# Patient Record
Sex: Female | Born: 1955 | Race: Black or African American | Hispanic: No | Marital: Married | Smoking: Current every day smoker
Health system: Southern US, Community
[De-identification: ages and names within clinical notes are randomized; demographics above are authoritative.]

---

## 2007-10-03 ENCOUNTER — Ambulatory Visit: Payer: Self-pay | Admitting: Family Medicine

## 2008-10-16 ENCOUNTER — Inpatient Hospital Stay: Payer: Self-pay | Admitting: Internal Medicine

## 2014-01-07 ENCOUNTER — Ambulatory Visit: Payer: Self-pay | Admitting: Emergency Medicine

## 2014-02-03 ENCOUNTER — Inpatient Hospital Stay: Payer: Self-pay | Admitting: Internal Medicine

## 2014-02-03 LAB — PROTIME-INR
INR: 0.8
PROTHROMBIN TIME: 11.5 s (ref 11.5–14.7)

## 2014-02-03 LAB — COMPREHENSIVE METABOLIC PANEL
ANION GAP: 13 (ref 7–16)
Albumin: 3 g/dL — ABNORMAL LOW (ref 3.4–5.0)
Alkaline Phosphatase: 89 U/L
BILIRUBIN TOTAL: 0.4 mg/dL (ref 0.2–1.0)
BUN: 9 mg/dL (ref 7–18)
CHLORIDE: 79 mmol/L — AB (ref 98–107)
CO2: 27 mmol/L (ref 21–32)
Calcium, Total: 9.1 mg/dL (ref 8.5–10.1)
Creatinine: 0.58 mg/dL — ABNORMAL LOW (ref 0.60–1.30)
EGFR (Non-African Amer.): >60
Glucose: 97 mg/dL (ref 65–99)
OSMOLALITY: 239 (ref 275–301)
POTASSIUM: 3.7 mmol/L (ref 3.5–5.1)
SGOT(AST): 30 U/L (ref 15–37)
SGPT (ALT): 19 U/L (ref 12–78)
Sodium: 119 mmol/L — CL (ref 136–145)
TOTAL PROTEIN: 7.4 g/dL (ref 6.4–8.2)

## 2014-02-03 LAB — CBC WITH DIFFERENTIAL/PLATELET
BASOS PCT: 0.5 %
Basophil #: 0.1 10*3/uL (ref 0.0–0.1)
EOS ABS: 0 10*3/uL (ref 0.0–0.7)
Eosinophil %: 0.3 %
HCT: 24.6 % — ABNORMAL LOW (ref 35.0–47.0)
HGB: 8.5 g/dL — AB (ref 12.0–16.0)
LYMPHS PCT: 11.1 %
Lymphocyte #: 1.3 10*3/uL (ref 1.0–3.6)
MCH: 33.3 pg (ref 26.0–34.0)
MCHC: 34.4 g/dL (ref 32.0–36.0)
MCV: 97 fL (ref 80–100)
Monocyte #: 0.8 x10 3/mm (ref 0.2–0.9)
Monocyte %: 6.8 %
NEUTROS ABS: 9.4 10*3/uL — AB (ref 1.4–6.5)
Neutrophil %: 81.3 %
PLATELETS: 422 10*3/uL (ref 150–440)
RBC: 2.54 10*6/uL — ABNORMAL LOW (ref 3.80–5.20)
RDW: 12.9 % (ref 11.5–14.5)
WBC: 11.6 10*3/uL — ABNORMAL HIGH (ref 3.6–11.0)

## 2014-02-03 LAB — URINALYSIS, COMPLETE
BACTERIA: NONE SEEN
BILIRUBIN, UR: NEGATIVE
Blood: NEGATIVE
GLUCOSE, UR: NEGATIVE mg/dL (ref 0–75)
Ketone: NEGATIVE
LEUKOCYTE ESTERASE: NEGATIVE
NITRITE: NEGATIVE
Ph: 7 (ref 4.5–8.0)
Protein: NEGATIVE
RBC,UR: NONE SEEN /HPF (ref 0–5)
Specific Gravity: 1.009 (ref 1.003–1.030)
Squamous Epithelial: <1
WBC UR: NONE SEEN /HPF (ref 0–5)

## 2014-02-03 LAB — LIPASE, BLOOD: Lipase: 483 U/L — ABNORMAL HIGH (ref 73–393)

## 2014-02-04 LAB — LIPID PANEL
CHOLESTEROL: 143 mg/dL (ref 0–200)
HDL Cholesterol: 65 mg/dL — ABNORMAL HIGH (ref 40–60)
Ldl Cholesterol, Calc: 63 mg/dL (ref 0–100)
Triglycerides: 76 mg/dL (ref 0–200)
VLDL Cholesterol, Calc: 15 mg/dL (ref 5–40)

## 2014-02-04 LAB — MAGNESIUM: Magnesium: 1.6 mg/dL — ABNORMAL LOW

## 2014-02-04 LAB — CBC WITH DIFFERENTIAL/PLATELET
Basophil #: 0 10*3/uL (ref 0.0–0.1)
Basophil %: 0.3 %
Eosinophil #: 0.1 10*3/uL (ref 0.0–0.7)
Eosinophil %: 0.9 %
HCT: 25.1 % — ABNORMAL LOW (ref 35.0–47.0)
HGB: 8.8 g/dL — AB (ref 12.0–16.0)
LYMPHS ABS: 1.9 10*3/uL (ref 1.0–3.6)
LYMPHS PCT: 18.3 %
MCH: 33.8 pg (ref 26.0–34.0)
MCHC: 35 g/dL (ref 32.0–36.0)
MCV: 97 fL (ref 80–100)
MONO ABS: 0.6 x10 3/mm (ref 0.2–0.9)
Monocyte %: 5.8 %
NEUTROS ABS: 7.7 10*3/uL — AB (ref 1.4–6.5)
Neutrophil %: 74.7 %
Platelet: 422 10*3/uL (ref 150–440)
RBC: 2.59 10*6/uL — AB (ref 3.80–5.20)
RDW: 12.8 % (ref 11.5–14.5)
WBC: 10.3 10*3/uL (ref 3.6–11.0)

## 2014-02-04 LAB — BASIC METABOLIC PANEL
Anion Gap: 7 (ref 7–16)
BUN: 7 mg/dL (ref 7–18)
CREATININE: 0.81 mg/dL (ref 0.60–1.30)
Calcium, Total: 9 mg/dL (ref 8.5–10.1)
Chloride: 87 mmol/L — ABNORMAL LOW (ref 98–107)
Co2: 27 mmol/L (ref 21–32)
EGFR (African American): >60
GLUCOSE: 86 mg/dL (ref 65–99)
Osmolality: 241 (ref 275–301)
POTASSIUM: 3.6 mmol/L (ref 3.5–5.1)
SODIUM: 121 mmol/L — AB (ref 136–145)

## 2014-03-06 ENCOUNTER — Ambulatory Visit: Payer: Self-pay | Admitting: Family Medicine

## 2014-03-20 ENCOUNTER — Ambulatory Visit: Payer: Self-pay | Admitting: Gastroenterology

## 2014-04-07 ENCOUNTER — Ambulatory Visit: Payer: Self-pay

## 2014-11-29 NOTE — Discharge Summary (Signed)
PATIENT NAME:  Diana Munoz, Cynitha R MR#:  [id number] DATE OF BIRTH:  [birth year]  DATE OF ADMISSION:  02/03/2014 DATE OF DISCHARGE:  02/06/2014  Discharge Diagnosis:   1.  Gastrointestinal bleed: due to gastric and duodenal ulcers due to overuse of mobic. EGD showed Hiatus hernia. Gastric ulcers with clean base. One duodenal ulcer with clean base. on PPI 2.  Hyponatremia: resolved with hydration. 3. constipation: on prn stool softners  SECONDARY DIAGNOSIS: Hypertension.  CONSULTATIONS:   GI: Dr Midge Miniumarren Wohl  PERTINENT PHYSICAL EXAMINATION ON THE DATE OF DISCHARGE: CARDIOVASCULAR: S1 and S2. No murmurs, rubs, or gallops. LUNGS: clear to ausculation, no wheezing, rales or rhonchi ABDOMEN: Soft, benign. NEURO: nonfocal examination, alert.   1.  Norvasc 5 mg p.o. daily.  2.  HCTZ 25 mg p.o. daily.  3.  Xyzal 5 milligrams p.o. daily.  4.  Senna 1 tablet p.o. b.i.d. as needed.  5.  Protonix 40 mg b.i.d.  6.  Sucralfate 10 mL p.o. 4 times a day. 7.  Tylenol 650 mg p.o. every eight hours as needed.    DISCHARGE DIET: Low sodium, low fat, low cholesterol.   DISCHARGE ACTIVITY: As tolerated.   DISCHARGE INSTRUCTIONS AND FOLLOW-UP: The patient was instructed to follow up with primary care physician, Dr. Darreld McleanLinda Miles, in 1 to 2 weeks. She will need follow-up with Dr. Midge Miniumarren Wohl from GI in 2 to 4 weeks. She was instructed to avoid any kind of nonsteroidal anti-inflammatory medication, including Mobic.  TOTAL TIME SPENT DISCHARGE PATIENT: 55 minutes.   ____________________________ Ellamae SiaVipul S. Sherryll BurgerShah, MD vss:cg D: 02/07/2014 23:33:00 ET T: 02/08/2014 05:50:24 ET JOB#: [id number]  cc: Leanna SatoLinda M. Miles, MD Midge Miniumarren Wohl, MD Raeden Schippers S. Sherryll BurgerShah, MD, <Dictator>    Ellamae SiaVIPUL S [facility] MD ELECTRONICALLY SIGNED 02/12/2014 11:41

## 2014-11-29 NOTE — H&P (Signed)
PATIENT NAME:  Diana Munoz, Diana Munoz MR#:  [id number] DATE OF BIRTH:  03/23/1956  DATE OF ADMISSION:  02/03/2014  PRIMARY CARE PHYSICIAN: None.   REFERRING EMERGENCY ROOM PHYSICIAN: Dr. Margarita GrizzleWoodruff  CHIEF COMPLAINT: Abdominal pain and dark stool.   HISTORY OF PRESENT ILLNESS: This is a 59 year old female with a past medical history alcoholism, hypertension, abdominal wall hernia, who came to the Emergency Room with complaint of having abdominal pain, shooting all over the body, 9 out of 10, radiating everywhere, and constant for the last few days. She is also staying constipated, and noticed a small amount of hard dark stool every time, whenever she tried to pass stool. She tried Gas-X and some other over-the-counter medications for relief, but did not have any relief with that. Also feeling somewhat nauseous, but no vomiting and no fever. Had some complaint of left hand pain, and she was given Mobic tablets for pain relief from the ER two weeks ago, which she had been taking.   REVIEW OF SYSTEMS:   CONSTITUTIONAL: Negative for weakness, pain or weight loss.  EYES: No blurring, double vision, discharge or redness.  EARS, NOSE, THROAT: No tinnitus, ear pain or hearing loss.  RESPIRATORY: No cough, wheezing, hemoptysis, or shortness of breath.  CARDIOVASCULAR: No chest pain, orthopnea, edema, arrhythmia or palpitations.  GASTROINTESTINAL: The patient has nausea and abdominal pain, and constipated. No diarrhea.  GENITOURINARY: No dysuria, hematuria, or increased frequency.  ENDOCRINE: No heat or cold intolerance. No excessive sweating.  SKIN: No acne, rashes, or lesions.  MUSCULOSKELETAL: No pain or swelling in the joints.  NEUROLOGIC: No numbness, weakness, tremor, or vertigo.  PSYCHIATRIC: No anxiety, insomnia, bipolar disorder.   PAST MEDICAL HISTORY: 1.  Hypertension.  2.  Abdominal wall hernia.    PAST SURGICAL HISTORY: Partial hysterectomy.   SOCIAL HISTORY: She smokes 5 to 6 cigarettes  daily, but says that she is trying to quit. For alcohol, she looks to be not telling me clearly, but then she told me that, yes, she drinks a small drink every day morning, as she works in the afternoon, but does not drink in the evening. Then when I asked about evening drink, she said that, yes, she drinks at night after she comes from work, which helps her to relax and to go to sleep. Denies any illicit drug use.   FAMILY HISTORY: Mother has a history of diabetes. Father has history of congestive heart failure.   HOME MEDICATIONS: 1.  Xyzal 5 mg oral tablet once a day.  2.  Norvasc 5 mg oral once a day.  3.  Mobic 15 mg oral tablet once a day. 4.  Hydrochlorothiazide 25 mg oral once a day.    PHYSICAL EXAMINATION: VITAL SIGNS: In the ER, temperature 98.8, pulse is 80, respirations 18, blood pressure 117/68. Pulse oximetry is 99% on room air.  GENERAL: The patient is fully alert and oriented to time, place and person. Appears thin and slightly disheveled.  HEENT: Head and neck atraumatic. Conjunctivae pink. Oral mucosa moist.  NECK: Supple. No JVD.  RESPIRATORY: Bilateral equal air entry.  CARDIOVASCULAR: S1, S2 present, regular. No murmur. ABDOMEN: Soft, nontender. Bowel sounds present. No organomegaly.  SKIN: No rashes.  EXTREMITIES: Legs: No edema.  JOINTS: No swelling or tenderness.  NEUROLOGICAL: Power 5/5. Follows commands. No tremor, no rigidity. PSYCHIATRIC: Does not appear in any acute psychiatric illness at this time.   IMPORTANT LABORATORY RESULTS: Glucose is 97. BUN 9, creatinine 0.158, sodium 119, potassium 3.7, chloride  79 and CO2 27. Lipase is 483. Total protein 7.4, albumin 3, bilirubin 0.4, alkaline phosphatase 89, SGOT 30, SGPT is 90. WBC 11.6, hemoglobin 8.5, platelet count 422, and MCV is 97. INR is 0.8.   ASSESSMENT AND PLAN: A 59 year old female with history of alcoholism and hypertension, who is taking Mobic for pain management for the last 2 to 3 weeks. Came with  abdominal pain, dark stool and constipation.  1.  Gastrointestinal bleed. Most likely this as use of pain medication, Mobic, which she is taking at home. Will hold it for now and give her IV fluids, keep her on clear liquid diet. Emergency Room already started on pantoprazole drip. Will call gastroenterology consult for management of her issue.  2.  Hypertension. We will continue amlodipine.  3.  Hyponatremia. We will give normal saline. Most likely this is due to her chronic alcoholism issue. Continue monitoring.  4.  Chronic alcoholism. Monitor. Currently there are no signs of withdrawal. If appears, we might need to put her on CIWA protocol.  5.  Tobacco abuse, smoking. Counseled for 4 minutes to quit smoking, and she agreed to quit or try. She will use nicotine patch. Total time spent for counseling was 4 minutes.   TOTAL TIME SPENT ON ADMISSION: 50 minutes.   CODE STATUS: Full code.   ____________________________ Diana Munoz Elisabeth Pigeon, MD vgv:cg D: 02/03/2014 18:07:58 ET T: 02/04/2014 02:02:56 ET JOB#: [id number]  cc: Diana Munoz. Elisabeth Pigeon, MD, <Dictator> Altamese Dilling MD ELECTRONICALLY SIGNED 02/04/2014 16:46

## 2014-11-29 NOTE — Consult Note (Signed)
Brief Consult Note: Diagnosis: GI Bleed/anemia.   Patient was seen by consultant.   Comments: Diana Munoz is a pleasant 59 y/o black female with 2 weeks of melena & abdominal pain after taking mobic admitted with anemia (HGB 8.8).  She will need EGD with Dr Servando SnareWohl to look for PUD, gastritis, or Mallory Weiss tear.  Discussed risks/benefits of procedure which include but are not limited to bleeding, infection, perforation & drug reaction.  Patient agrees with this plan & consent will be obtained.  Plan: 1) Follow Hgb 2) Agree with Protonix drip 3) NPO after MN, clear liquids  4) continue supportive measures Thanks for allowing us to participate in her care.  Please see full dictated note. #[id number].  Electronic Signatures: Joselyn ArrowJones, Kandice L (NP)  (Signed 30-Jun-15 13:40)  Authored: Brief Consult Note   Last Updated: 30-Jun-15 13:40 by Joselyn ArrowJones, Kandice L (NP)

## 2014-11-29 NOTE — Consult Note (Signed)
Chief Complaint:  Subjective/Chief Complaint Pt denies melena, abdominal pain, nausea or vomiting.   VITAL SIGNS/ANCILLARY NOTES: **Vital Signs.:   02-Jul-15 07:49  Vital Signs Type Q 8hr  Temperature Temperature (F) 98.9  Celsius 37.1  Temperature Source oral  Pulse Pulse 76  Respirations Respirations 16  Systolic BP Systolic BP 139  Diastolic BP (mmHg) Diastolic BP (mmHg) 80  Mean BP 99  Pulse Ox % Pulse Ox % 98  Pulse Ox Activity Level  At rest  Oxygen Delivery Room Air/ 21 %   Brief Assessment:  GEN well developed, well nourished, no acute distress   Cardiac Regular   Respiratory normal resp effort   Gastrointestinal Normal   Gastrointestinal details normal Soft  Nontender  Nondistended  Bowel sounds normal  No rebound tenderness  No gaurding  No rigidity   EXTR negative cyanosis/clubbing, negative edema   Additional Physical Exam Skin: warm, dry, intact   Assessment/Plan:  Assessment/Plan:  Assessment PUD/Duodenal ulcer:  On BID PPI Anemia: secondary to melena   Plan 1) Protonix 40mg  BID 2) Avoid NSAIDS 3) Office visit in 4 weeks Will sign off, Please call if you have any questions or concerns   Electronic Signatures: Joselyn ArrowJones, Zedrick Springsteen L (NP)  (Signed 02-Jul-15 14:24)  Authored: Chief Complaint, VITAL SIGNS/ANCILLARY NOTES, Brief Assessment, Assessment/Plan   Last Updated: 02-Jul-15 14:24 by Joselyn ArrowJones, Artist Bloom L (NP)

## 2014-11-29 NOTE — Consult Note (Signed)
PATIENT NAME:  Diana Munoz, Reverie R MR#:  [id number] DATE OF BIRTH:  [birth year]  DATE OF CONSULTATION:  02/04/2014  REFERRING PHYSICIAN:  Altamese DillingVaibhavkumar Vachhani, MD CONSULTING PHYSICIAN:  Joselyn ArrowKandice L. Jones, NP  PRIMARY CARE PHYSICIAN: Darreld McleanLinda Miles, MD [facility]  REASON FOR CONSULTATION: GI bleed.   HISTORY OF PRESENT ILLNESS: Diana Munoz is a 59 year old female with melena. She was admitted with a hemoglobin of 8.5 yesterday, 8.8 today. She has a hematocrit of 25.1 and a normal INR. She had been taking Mobic for left hand pain since June 2nd. She denies any other NSAIDs. She says 2 weeks ago she had horrible heartburn and abdominal pain that started in the middle of her abdomen and radiated throughout her abdomen. She was having 4 to 5 dark melanotic stools per day for the last 2 weeks and last episode was yesterday. She states the pain was at her umbilicus and referred to her back and her neck. Nothing seemed to help. The pain is 10 out of 10 on a pain scale. She has had good relief with IV pain medicines here. She tells me she drinks 1 shot of vodka nightly after work. Denies any other alcohol use. Denies any history of alcohol abuse or heavy intermittent drinking. She denies any illicit drug use. She does have heartburn and indigestion several days a week. It is intermittent. She drinks milk and that seems to help some. Her symptoms are worse if she uses tomato-based products. She does report a few pound weight loss in the last 2 weeks. She has had anorexia for the last 2 weeks. She has had chills and felt hot, but she did not take her temperature.   PAST MEDICAL AND SURGICAL HISTORY: Hypertension, abdominal wall hernia, pancreatitis felt to be due possibly to alcohol or idiopathic in March 2010, partial hysterectomy.   MEDICATIONS PRIOR TO ADMISSION: Hydrochlorothiazide 25 mg daily, Mobic 15 mg daily, Norvasc 5 mg daily, Xyzal 5 mg at bedtime.   ALLERGIES: LISINOPRIL CAUSED ANGIOEDEMA, PENICILLIN  CAUSED HIVES.   FAMILY HISTORY: Her father had an ostomy, but she is unsure of the reason why. No known history of colorectal carcinoma, liver or chronic GI problems.   SOCIAL HISTORY: She has smoked a 1/3 to 1/4 pack per day for the last 21 years. She has moderate alcohol consumption. She denies any history of alcohol abuse. She has been separated for the last 3 years. 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 She has 1 son who is healthy. She is a caregiver for her parents and they reside with her.   REVIEW OF SYSTEMS: See HPI. Otherwise, negative 12 point complete review of systems.   PHYSICAL EXAMINATION: VITAL SIGNS: Temperature 98.6, pulse 71, respirations 16, blood pressure 107/70, O2 sat 95% on room air.  GENERAL: She is a well-developed, thin black female who is alert, oriented, pleasant, and cooperative, in no acute distress.  HEENT: Sclerae clear, anicteric. Conjunctivae pink. Oropharynx pink and moist without lesions.  NECK: Supple without mass or thyromegaly.  CHEST: Heart regular rate and rhythm with normal S1, S2. No murmurs, clicks, rubs, or gallops.  LUNGS: Clear to auscultation bilaterally.  ABDOMEN: Positive bowel sounds x4. She does have a small easily reducible nontender and soft umbilical hernia. There is no rebound tenderness or guarding. No hepatosplenomegaly or mass.  EXTREMITIES: Without clubbing or edema bilaterally.  SKIN: Warm and dry without any rash or jaundice.  MUSCULOSKELETAL: Good equal strength and movement bilaterally.  NEUROLOGIC: Grossly  intact.  PSYCHIATRIC: Alert and cooperative. Normal mood and affect.   DIAGNOSTIC DATA: Sodium 121 and chloride 87, otherwise normal basic metabolic panel. Her lipase was 43 yesterday. Magnesium 1.6.   IMPRESSION: Diana Munoz is a pleasant 59 year old black female with 2 weeks of melena and abdominal pain after taking Mobic, admitted with anemia and hemoglobin is 8.8 today. She will need EGD with Dr. Servando Snare to look  for peptic ulcer disease, gastritis, or Mallory-Weiss tear. I have discussed risks and benefits of the procedure which include but are not limited to bleeding, infection, perforation, and drug reaction. She agrees with this plan and consent will be obtained.   PLAN: 1.  Follow hemoglobin.  2.  Agree with Protonix drip.  3.  N.p.o. after midnight and clear liquids today.  4.  Continue supportive measures.   Thank you for allowing [place] to participate in the care of Ms. [place]. ____________________________ Joselyn Arrow, NP klj:sb D: 02/04/2014 13:39:42 ET T: 02/04/2014 14:08:06 ET JOB#: [id number]  cc: Joselyn Arrow, NP, <Dictator> [facility] Karsten Ro FNP ELECTRONICALLY SIGNED 02/07/2014 16:50

## 2015-07-01 ENCOUNTER — Ambulatory Visit: Payer: Self-pay | Attending: Oncology

## 2015-07-15 ENCOUNTER — Ambulatory Visit: Payer: Self-pay | Attending: Oncology

## 2016-12-27 ENCOUNTER — Telehealth: Payer: Self-pay | Admitting: *Deleted

## 2016-12-27 NOTE — Telephone Encounter (Signed)
Received referral for low dose lung cancer screening CT scan. Message left at phone number listed in EMR for patient to call me back to facilitate scheduling scan.  

## 2016-12-28 ENCOUNTER — Telehealth: Payer: Self-pay | Admitting: *Deleted

## 2016-12-28 ENCOUNTER — Other Ambulatory Visit: Payer: Self-pay | Admitting: Family Medicine

## 2016-12-28 DIAGNOSIS — Z87891 Personal history of nicotine dependence: Secondary | ICD-10-CM

## 2016-12-28 DIAGNOSIS — Z1231 Encounter for screening mammogram for malignant neoplasm of breast: Secondary | ICD-10-CM

## 2016-12-28 NOTE — Telephone Encounter (Signed)
Received referral for initial lung cancer screening scan. Contacted patient and obtained smoking history,(current, 30 pack year) as well as answering questions related to screening process. Patient denies signs of lung cancer such as weight loss or hemoptysis. Patient denies comorbidity that would prevent curative treatment if lung cancer were found. Patient is scheduled for shared decision making visit and CT scan on 01/10/17.

## 2017-01-09 ENCOUNTER — Encounter: Payer: Self-pay | Admitting: Oncology

## 2017-01-10 ENCOUNTER — Ambulatory Visit
Admission: RE | Admit: 2017-01-10 | Discharge: 2017-01-10 | Disposition: A | Discharge status: Home / Self Care | Payer: Self-pay | Source: Ambulatory Visit | Attending: Oncology | Admitting: Oncology

## 2017-01-10 ENCOUNTER — Inpatient Hospital Stay: Payer: Self-pay | Attending: Oncology | Admitting: Oncology

## 2017-01-10 DIAGNOSIS — I2510 Atherosclerotic heart disease of native coronary artery without angina pectoris: Secondary | ICD-10-CM | POA: Insufficient documentation

## 2017-01-10 DIAGNOSIS — F17210 Nicotine dependence, cigarettes, uncomplicated: Secondary | ICD-10-CM

## 2017-01-10 DIAGNOSIS — Z87891 Personal history of nicotine dependence: Secondary | ICD-10-CM

## 2017-01-10 DIAGNOSIS — Z122 Encounter for screening for malignant neoplasm of respiratory organs: Secondary | ICD-10-CM

## 2017-01-11 ENCOUNTER — Encounter: Payer: Self-pay | Admitting: *Deleted

## 2017-01-12 ENCOUNTER — Ambulatory Visit
Admission: RE | Admit: 2017-01-12 | Discharge: 2017-01-12 | Disposition: A | Discharge status: Home / Self Care | Payer: Self-pay | Source: Ambulatory Visit | Attending: Family Medicine | Admitting: Family Medicine

## 2017-01-12 ENCOUNTER — Encounter: Payer: Self-pay | Admitting: Radiology

## 2017-01-12 DIAGNOSIS — Z1231 Encounter for screening mammogram for malignant neoplasm of breast: Secondary | ICD-10-CM | POA: Insufficient documentation

## 2017-01-15 DIAGNOSIS — Z87891 Personal history of nicotine dependence: Secondary | ICD-10-CM | POA: Insufficient documentation

## 2017-01-15 NOTE — Progress Notes (Signed)
In accordance with CMS guidelines, patient has met eligibility criteria including age, absence of signs or symptoms of lung cancer.  Social History  Substance Use Topics  . Smoking status: Current Every Day Smoker    Packs/day: 0.80    Years: 38.00  . Smokeless tobacco: Not on file  . Alcohol use Not on file     A shared decision-making session was conducted prior to the performance of CT scan. This includes one or more decision aids, includes benefits and harms of screening, follow-up diagnostic testing, over-diagnosis, false positive rate, and total radiation exposure.  Counseling on the importance of adherence to annual lung cancer LDCT screening, impact of co-morbidities, and ability or willingness to undergo diagnosis and treatment is imperative for compliance of the program.  Counseling on the importance of continued smoking cessation for former smokers; the importance of smoking cessation for current smokers, and information about tobacco cessation interventions have been given to patient including [place] and [facility] quit [place] programs.  Written order for lung cancer screening with LDCT has been given to the patient and any and all questions have been answered to the best of my abilities.   Yearly follow up will be coordinated by Burgess Estelle, Thoracic Navigator.

## 2018-01-23 ENCOUNTER — Telehealth: Payer: Self-pay | Admitting: Nurse Practitioner

## 2018-01-24 ENCOUNTER — Telehealth: Payer: Self-pay | Admitting: *Deleted

## 2018-01-24 NOTE — Telephone Encounter (Signed)
Patient contacted regarding lung screening due at this time. She reports she is not currently interested in having further scans.

## 2018-02-16 ENCOUNTER — Encounter: Payer: Self-pay | Admitting: *Deleted

## 2018-06-11 ENCOUNTER — Other Ambulatory Visit: Payer: Self-pay | Admitting: Family Medicine

## 2018-06-11 DIAGNOSIS — Z1231 Encounter for screening mammogram for malignant neoplasm of breast: Secondary | ICD-10-CM

## 2018-10-15 ENCOUNTER — Inpatient Hospital Stay: Admission: RE | Admit: 2018-10-15 | Payer: Self-pay | Source: Ambulatory Visit

## 2019-03-26 ENCOUNTER — Other Ambulatory Visit: Payer: Self-pay | Admitting: Family Medicine

## 2019-03-26 DIAGNOSIS — Z1231 Encounter for screening mammogram for malignant neoplasm of breast: Secondary | ICD-10-CM

## 2019-04-22 ENCOUNTER — Inpatient Hospital Stay: Admission: RE | Admit: 2019-04-22 | Payer: Medicaid Other | Source: Ambulatory Visit

## 2020-02-26 ENCOUNTER — Other Ambulatory Visit: Payer: Self-pay | Admitting: Family Medicine

## 2020-02-26 DIAGNOSIS — Z1231 Encounter for screening mammogram for malignant neoplasm of breast: Secondary | ICD-10-CM

## 2020-03-24 ENCOUNTER — Inpatient Hospital Stay: Admission: RE | Admit: 2020-03-24 | Payer: Medicaid Other | Source: Ambulatory Visit

## 2020-04-28 ENCOUNTER — Other Ambulatory Visit: Payer: Self-pay

## 2020-04-28 ENCOUNTER — Encounter (INDEPENDENT_AMBULATORY_CARE_PROVIDER_SITE_OTHER): Payer: Self-pay

## 2020-04-28 ENCOUNTER — Ambulatory Visit: Payer: BLUE CROSS/BLUE SHIELD | Attending: Family Medicine

## 2021-04-28 ENCOUNTER — Other Ambulatory Visit: Payer: Self-pay | Admitting: Family Medicine

## 2021-04-28 DIAGNOSIS — Z1231 Encounter for screening mammogram for malignant neoplasm of breast: Secondary | ICD-10-CM

## 2021-06-03 ENCOUNTER — Other Ambulatory Visit: Payer: Self-pay

## 2021-06-03 ENCOUNTER — Ambulatory Visit
Admission: RE | Admit: 2021-06-03 | Discharge: 2021-06-03 | Disposition: A | Discharge status: Home / Self Care | Payer: BLUE CROSS/BLUE SHIELD | Source: Ambulatory Visit | Attending: Family Medicine | Admitting: Family Medicine

## 2021-06-03 DIAGNOSIS — Z1231 Encounter for screening mammogram for malignant neoplasm of breast: Secondary | ICD-10-CM | POA: Diagnosis not present

## 2022-04-03 IMAGING — MG MM DIGITAL SCREENING BILAT W/ TOMO AND CAD
8 series · 9 of 24 positions shown · non-contrast
Comparison: Previous exam(s).

CLINICAL DATA: Screening.

EXAM:
DIGITAL SCREENING BILATERAL MAMMOGRAM WITH TOMOSYNTHESIS AND CAD
TECHNIQUE: Bilateral screening digital craniocaudal and mediolateral oblique
mammograms were obtained. Bilateral screening digital breast
tomosynthesis was performed. The images were evaluated with
computer-aided detection.

[L CC synth-2D]
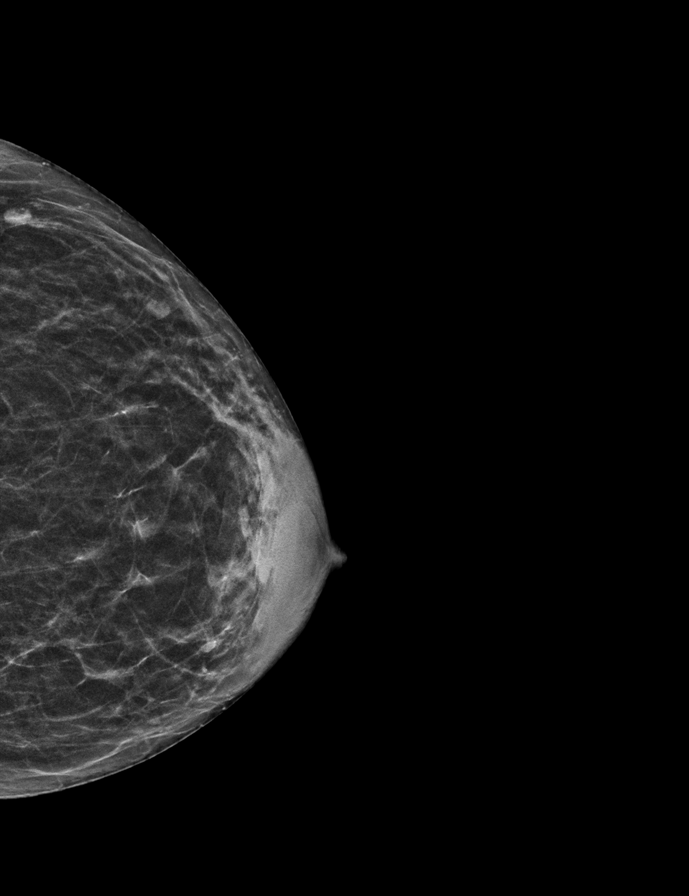

[R CC synth-2D]
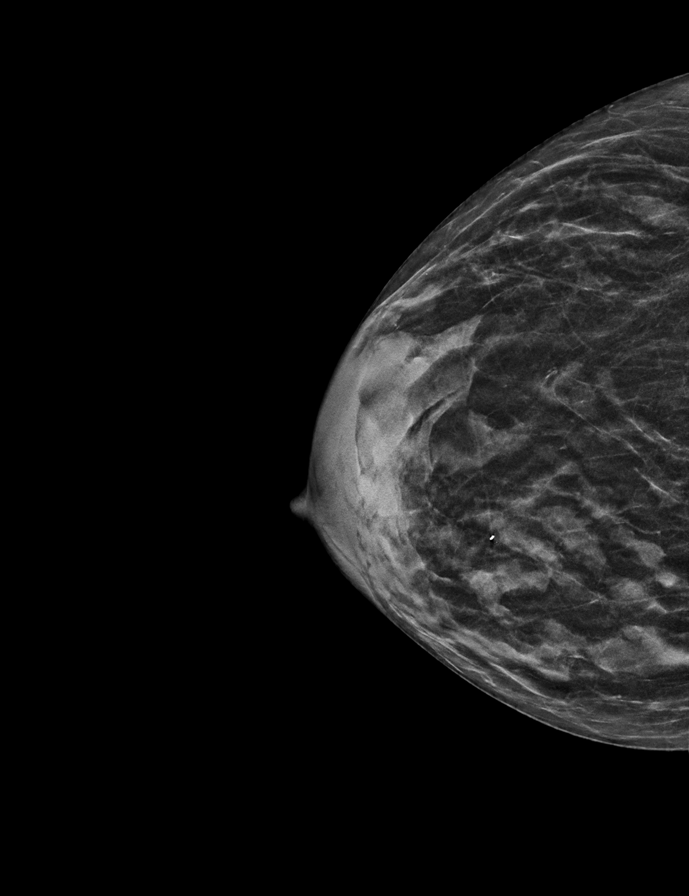

[L MLO synth-2D]
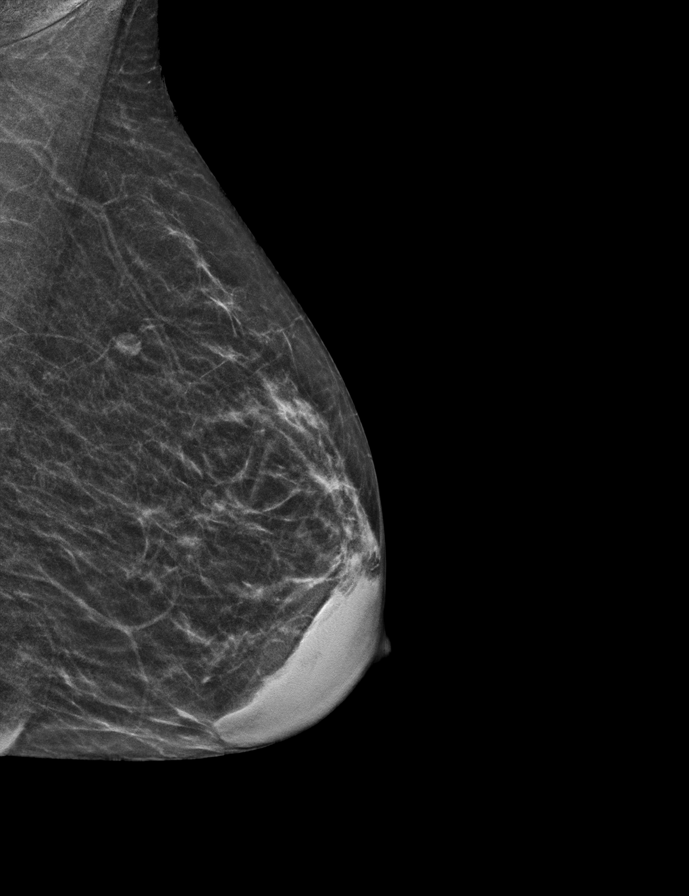

[R MLO synth-2D]
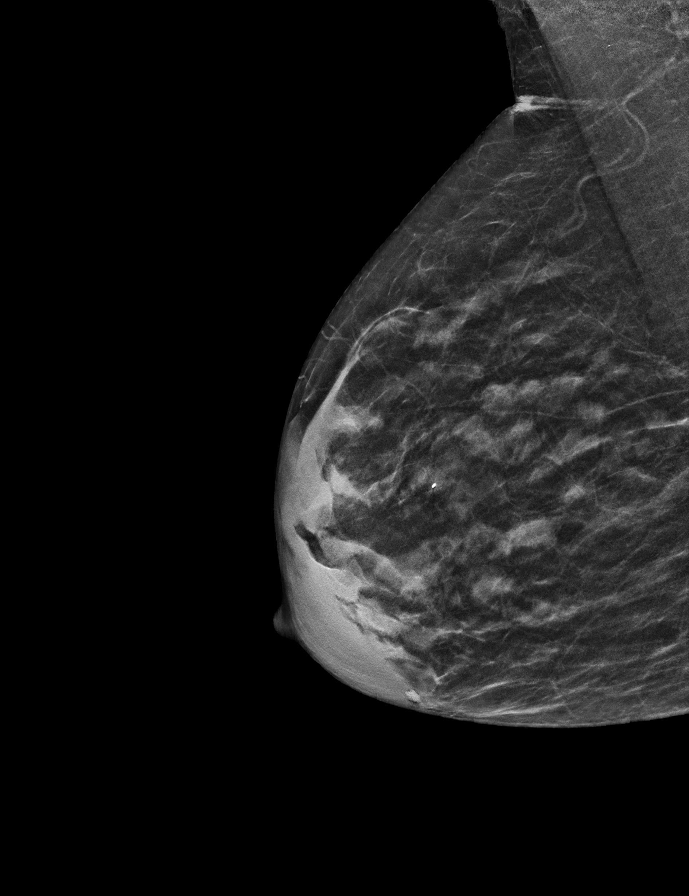

[L CC tomo · 2 of 42 frames shown]
[frame 14/42]
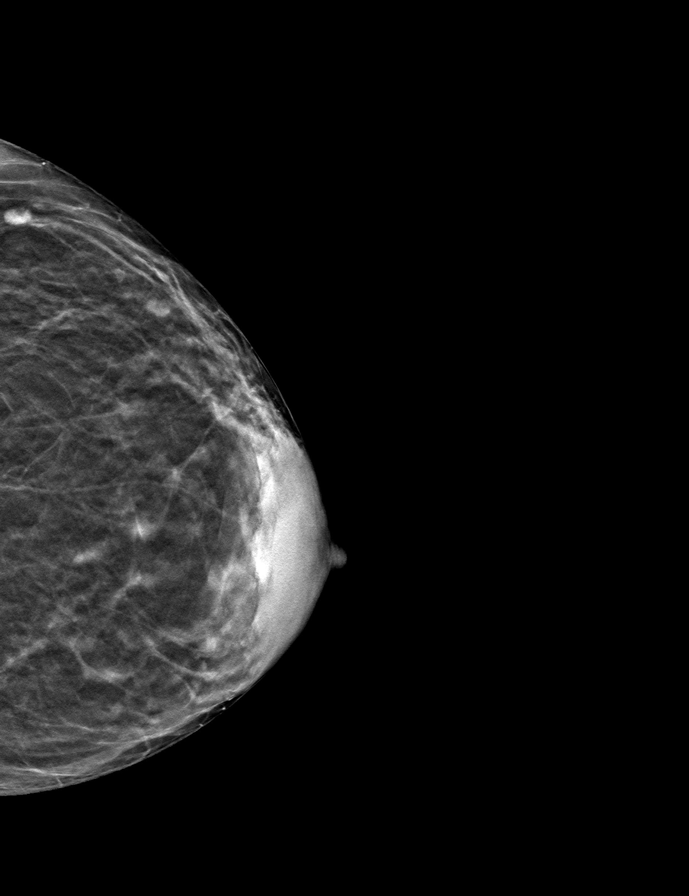
[frame 21/42]
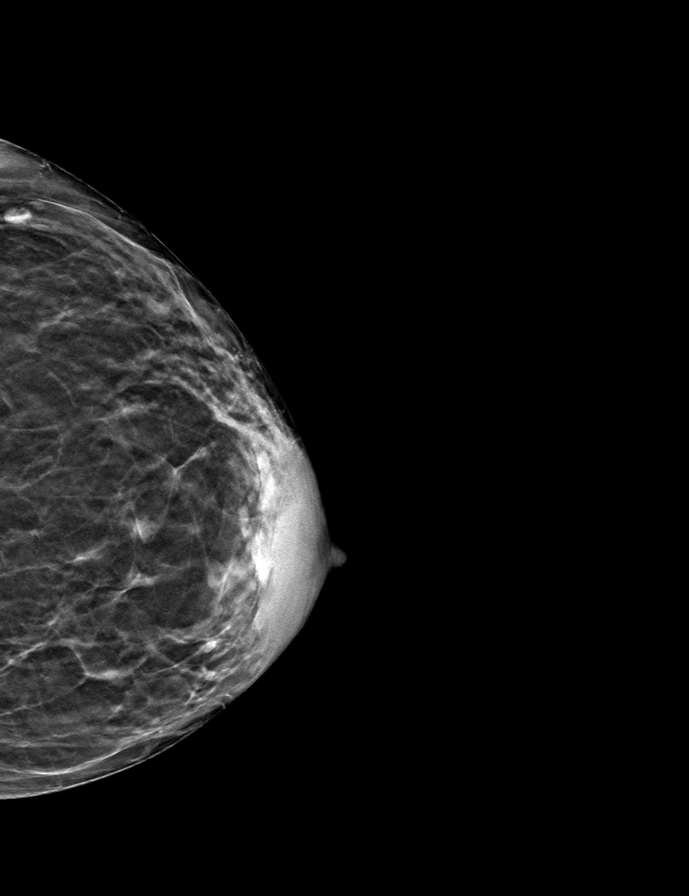

[L MLO tomo · tomo slice 21/42.0]
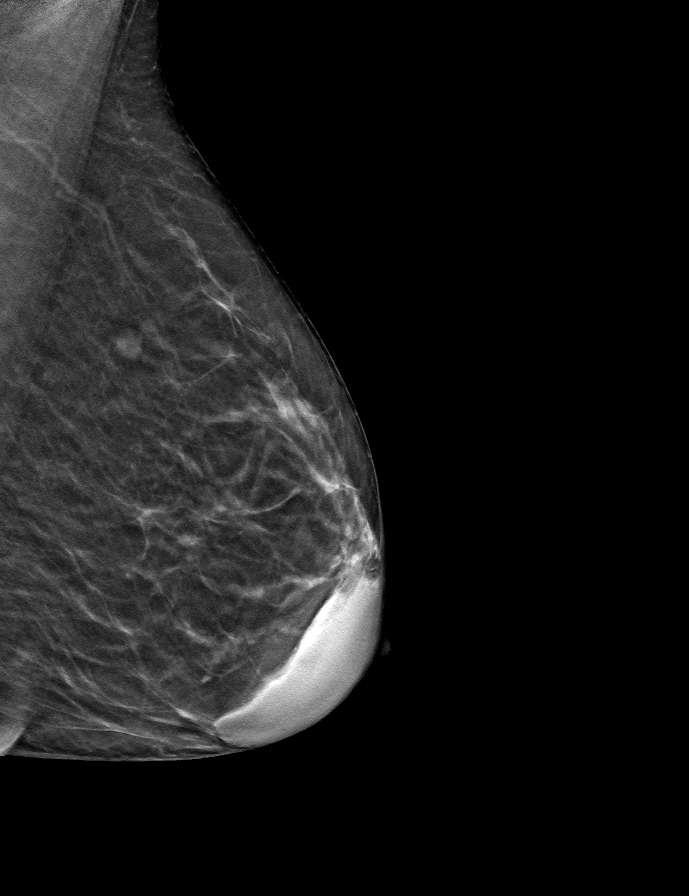

[R MLO tomo · tomo slice 21/41.0]
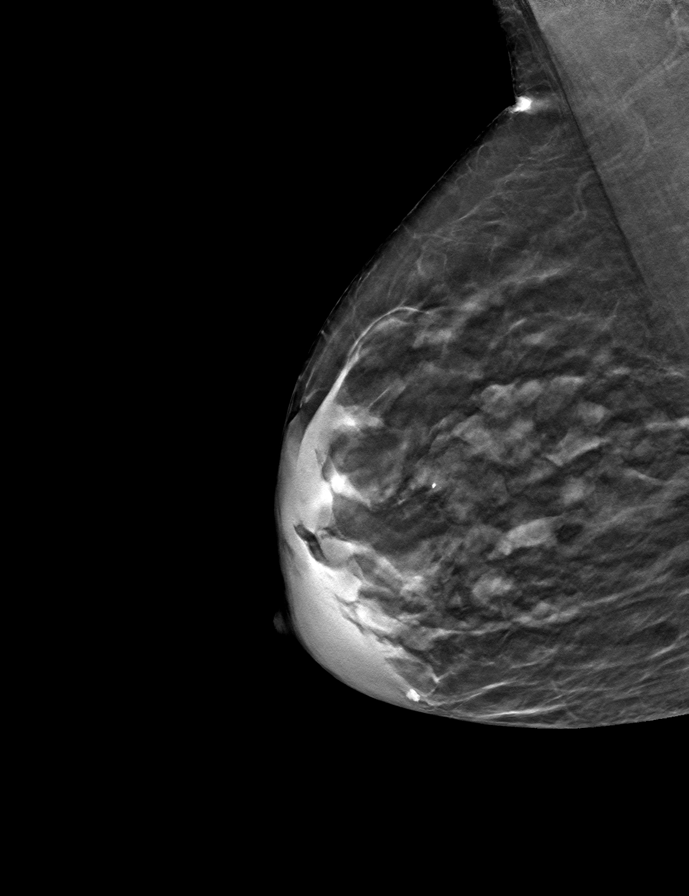

[R CC tomo · tomo slice 21/40.0]
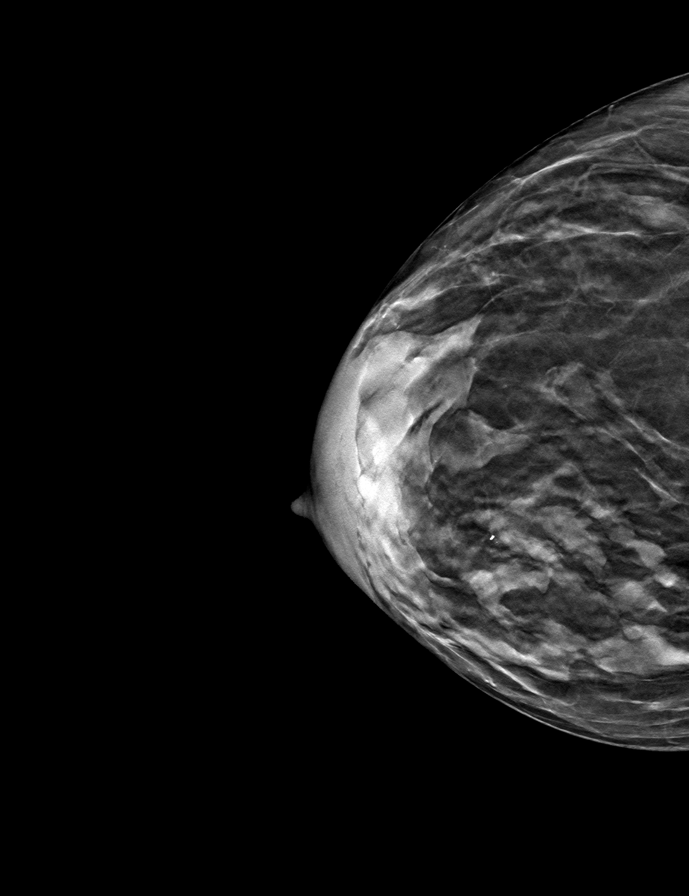

[9 of 24 positions shown; findings below may reference images not displayed]

ACR Breast Density Category c: The breast tissue is heterogeneously
dense, which may obscure small masses.
FINDINGS: There are no findings suspicious for malignancy.
IMPRESSION: No mammographic evidence of malignancy. A result letter of this
screening mammogram will be mailed directly to the patient.

RECOMMENDATION:
Screening mammogram in one year. (Code:Q3-W-BC3)

BI-RADS CATEGORY  1: Negative.
